# Patient Record
Sex: Male | Born: 1965 | Hispanic: No | Marital: Married | Smoking: Current every day smoker
Health system: Southern US, Community
[De-identification: ages and names within clinical notes are randomized; demographics above are authoritative.]

## PROBLEM LIST (undated history)

## (undated) HISTORY — PX: NO PAST SURGERIES: SHX2092

---

## 2003-06-21 ENCOUNTER — Ambulatory Visit (HOSPITAL_COMMUNITY): Admission: RE | Admit: 2003-06-21 | Discharge: 2003-06-21 | Payer: Self-pay | Admitting: Internal Medicine

## 2004-12-09 ENCOUNTER — Emergency Department (HOSPITAL_COMMUNITY): Admission: EM | Admit: 2004-12-09 | Discharge: 2004-12-10 | Payer: Self-pay | Admitting: Emergency Medicine

## 2004-12-19 ENCOUNTER — Emergency Department (HOSPITAL_COMMUNITY): Admission: EM | Admit: 2004-12-19 | Discharge: 2004-12-19 | Payer: Self-pay | Admitting: Emergency Medicine

## 2005-10-21 ENCOUNTER — Emergency Department: Payer: Self-pay | Admitting: Emergency Medicine

## 2006-03-02 ENCOUNTER — Emergency Department (HOSPITAL_COMMUNITY): Admission: EM | Admit: 2006-03-02 | Discharge: 2006-03-02 | Payer: Self-pay | Admitting: Emergency Medicine
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
## 2009-07-21 ENCOUNTER — Emergency Department (HOSPITAL_COMMUNITY): Admission: EM | Admit: 2009-07-21 | Discharge: 2009-07-22 | Payer: Self-pay | Admitting: Emergency Medicine

## 2014-01-28 ENCOUNTER — Emergency Department: Payer: Self-pay | Admitting: Emergency Medicine

## 2014-01-28 LAB — LIPASE, BLOOD: Lipase: 86 U/L (ref 73–393)

## 2014-01-28 LAB — CBC
HCT: 43 % (ref 40.0–52.0)
HGB: 14.3 g/dL (ref 13.0–18.0)
MCH: 32.1 pg (ref 26.0–34.0)
MCHC: 33.3 g/dL (ref 32.0–36.0)
MCV: 96 fL (ref 80–100)
Platelet: 348 10*3/uL (ref 150–440)
RBC: 4.45 10*6/uL (ref 4.40–5.90)
RDW: 14.2 % (ref 11.5–14.5)
WBC: 10.9 10*3/uL — ABNORMAL HIGH (ref 3.8–10.6)

## 2014-01-28 LAB — COMPREHENSIVE METABOLIC PANEL
Albumin: 4.1 g/dL (ref 3.4–5.0)
Alkaline Phosphatase: 84 U/L
Anion Gap: 12 (ref 7–16)
BUN: 7 mg/dL (ref 7–18)
Bilirubin,Total: 0.2 mg/dL (ref 0.2–1.0)
Calcium, Total: 9 mg/dL (ref 8.5–10.1)
Chloride: 109 mmol/L — ABNORMAL HIGH (ref 98–107)
Co2: 24 mmol/L (ref 21–32)
Creatinine: 1.01 mg/dL (ref 0.60–1.30)
EGFR (African American): >60
EGFR (Non-African Amer.): >60
Glucose: 97 mg/dL (ref 65–99)
Osmolality: 287 (ref 275–301)
Potassium: 3.9 mmol/L (ref 3.5–5.1)
SGOT(AST): 23 U/L (ref 15–37)
SGPT (ALT): 30 U/L
Sodium: 145 mmol/L (ref 136–145)
Total Protein: 7.9 g/dL (ref 6.4–8.2)

## 2014-01-28 LAB — TROPONIN I
Troponin-I: <0.02 ng/mL
Troponin-I: <0.02 ng/mL

## 2018-08-13 ENCOUNTER — Other Ambulatory Visit: Payer: Self-pay

## 2018-08-13 ENCOUNTER — Ambulatory Visit
Admission: EM | Admit: 2018-08-13 | Discharge: 2018-08-13 | Disposition: A | Discharge status: Home / Self Care | Payer: BLUE CROSS/BLUE SHIELD | Attending: Family Medicine | Admitting: Family Medicine

## 2018-08-13 ENCOUNTER — Encounter: Payer: Self-pay | Admitting: Emergency Medicine

## 2018-08-13 DIAGNOSIS — M5431 Sciatica, right side: Secondary | ICD-10-CM | POA: Diagnosis not present

## 2018-08-13 DIAGNOSIS — M5432 Sciatica, left side: Secondary | ICD-10-CM | POA: Diagnosis not present

## 2018-08-13 MED ORDER — HYDROCODONE-ACETAMINOPHEN 5-325 MG PO TABS: ORAL_TABLET | ORAL | 0 refills | Status: AC

## 2018-08-13 MED ORDER — CYCLOBENZAPRINE HCL 10 MG PO TABS: 10.0000 mg | ORAL_TABLET | Freq: Every day | ORAL | 0 refills | Status: AC

## 2018-08-13 MED ORDER — PREDNISONE 20 MG PO TABS: ORAL_TABLET | ORAL | 0 refills | Status: AC

## 2018-08-13 NOTE — Discharge Instructions (Signed)
Follow up with Primary Care provider for possible referrals for MRI and/or back specialist

## 2018-08-13 NOTE — ED Triage Notes (Signed)
Patient in today c/o low back pain that goes into hips and radiates down his leg x 1 month. 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

## 2018-08-13 NOTE — ED Provider Notes (Signed)
[facility]    CSN: [id number] Arrival date & time: 08/13/18  2751     History   Chief Complaint Chief Complaint  Patient presents with  . Back Pain    HPI Frederick Walters is a 53 y.o. male.   53 yo male with a c/o low back pain that radiates down his legs intermittently and alternating legs. Also states sometimes feels tingling down his legs. States he's had this for about 1 year and has seen PCP in the past for this and treated for sciatica. Patient states seems to be getting worse. Denies any saddle anesthesia, bowel or bladder changes/problems, fevers.      History reviewed. No pertinent past medical history.  There are no active problems to display for this patient.   Past Surgical History:  Procedure Laterality Date  . NO PAST SURGERIES         Home Medications    Prior to Admission medications   Medication Sig Start Date End Date Taking? Authorizing Provider  cyclobenzaprine (FLEXERIL) 10 MG tablet Take 1 tablet (10 mg total) by mouth at bedtime. 08/13/18   Norval Gable, MD  HYDROcodone-acetaminophen (NORCO/VICODIN) 5-325 MG tablet 1-2 tabs po qd prn 08/13/18   Norval Gable, MD  predniSONE (DELTASONE) 20 MG tablet 3 tabs po qd x 2 days, then 2 tabs po qd x 2 days, then 1 tab po qd x 2 days, then half a tab po qd x 2 days 08/13/18   Norval Gable, MD    Family History Family History  Problem Relation Age of Onset  . COPD Mother   . CAD Mother 80       stent x 1  . Hypertension Mother   . Diabetes Mother   . CAD Father        stent x 1  . Hypertension Father   . Hyperlipidemia Father   . Diabetes Father     Social History Social History   Tobacco Use  . Smoking status: Current Every Day Smoker    Packs/day: 1.00    Years: 25.00    Pack years: 25.00    Types: Cigarettes  . Smokeless tobacco: Never Used  Substance Use Topics  . Alcohol use: Yes    Alcohol/week: 28.0 standard drinks    Types: 28 Cans of beer per week   Comment: 4-5 beers every evening  . Drug use: Never     Allergies   Patient has no known allergies.   Review of Systems Review of Systems   Physical Exam Triage Vital Signs ED Triage Vitals  Enc Vitals Group     BP 08/13/18 0823 (!) 151/95     Pulse Rate 08/13/18 0823 (!) 56     Resp 08/13/18 0823 16     Temp 08/13/18 0823 97.7 F (36.5 C)     Temp Source 08/13/18 0823 Oral     SpO2 08/13/18 0823 99 %     Weight 08/13/18 0824 197 lb (89.4 kg)     Height 08/13/18 0824 6' (1.829 m)     Head Circumference --      Peak Flow --      Pain Score 08/13/18 0824 8     Pain Loc --      Pain Edu? --      Excl. in [place]? --    No data found.  Updated Vital Signs BP (!) 151/95 (BP Location: Left Arm)   Pulse (!) 56   Temp 97.7  F (36.5 C) (Oral)   Resp 16   Ht 6' (1.829 m)   Wt 89.4 kg   SpO2 99%   BMI 26.72 kg/m   Visual Acuity Right Eye Distance:   Left Eye Distance:   Bilateral Distance:    Right Eye Near:   Left Eye Near:    Bilateral Near:     Physical Exam Vitals signs and nursing note reviewed.  Constitutional:      General: He is not in acute distress.    Appearance: He is well-developed. He is not toxic-appearing or diaphoretic.  Neck:     Trachea: No tracheal deviation.  Pulmonary:     Effort: Pulmonary effort is normal. No respiratory distress.     Breath sounds: No stridor.  Musculoskeletal:     Lumbar back: He exhibits tenderness (paraspinous muscles) and spasm. He exhibits normal range of motion, no bony tenderness, no swelling, no edema, no deformity, no laceration, no pain and normal pulse.  Skin:    Findings: No rash.  Neurological:     General: No focal deficit present.     Mental Status: He is alert.     Motor: No abnormal muscle tone.     Coordination: Coordination normal.     Deep Tendon Reflexes: Reflexes are normal and symmetric. Reflexes normal.      UC Treatments / Results  Labs (all labs ordered are listed, but only abnormal  results are displayed) Labs Reviewed - No data to display  EKG None  Radiology No results found.  Procedures Procedures (including critical care time)  Medications Ordered in UC Medications - No data to display  Initial Impression / Assessment and Plan / UC Course  I have reviewed the triage vital signs and the nursing notes.  Pertinent labs & imaging results that were available during my care of the patient were reviewed by me and considered in my medical decision making (see chart for details).      Final Clinical Impressions(s) / UC Diagnoses   Final diagnoses:  Bilateral sciatica     Discharge Instructions     Follow up with Primary Care provider for possible referrals for MRI and/or back specialist    ED Prescriptions    Medication Sig Dispense Auth. Provider   predniSONE (DELTASONE) 20 MG tablet 3 tabs po qd x 2 days, then 2 tabs po qd x 2 days, then 1 tab po qd x 2 days, then half a tab po qd x 2 days 13 tablet Yong Grieser, MD   cyclobenzaprine (FLEXERIL) 10 MG tablet Take 1 tablet (10 mg total) by mouth at bedtime. 30 tablet Norval Gable, MD   HYDROcodone-acetaminophen (NORCO/VICODIN) 5-325 MG tablet 1-2 tabs po qd prn 6 tablet Norval Gable, MD     1. diagnosis reviewed with patient 2. rx as per orders above; reviewed possible side effects, interactions, risks and benefits  3. Recommend supportive treatment with rest, heat/ice to area 4. Follow-up with PCP for further evaluation, referrals as noted above 5. F/u prn   Controlled Substance Prescriptions [place] Controlled Substance Registry consulted?    Norval Gable, MD 08/13/18 [phone]

## 2020-10-09 ENCOUNTER — Other Ambulatory Visit: Payer: Self-pay | Admitting: Family Medicine

## 2020-10-09 DIAGNOSIS — K219 Gastro-esophageal reflux disease without esophagitis: Secondary | ICD-10-CM

## 2020-10-11 ENCOUNTER — Other Ambulatory Visit: Payer: Self-pay | Admitting: Family Medicine

## 2020-10-11 DIAGNOSIS — F1010 Alcohol abuse, uncomplicated: Secondary | ICD-10-CM

## 2020-10-11 DIAGNOSIS — R140 Abdominal distension (gaseous): Secondary | ICD-10-CM

## 2020-10-11 DIAGNOSIS — K219 Gastro-esophageal reflux disease without esophagitis: Secondary | ICD-10-CM

## 2020-10-12 ENCOUNTER — Ambulatory Visit
Admission: RE | Admit: 2020-10-12 | Discharge: 2020-10-12 | Disposition: A | Discharge status: Home / Self Care | Payer: BC Managed Care – PPO | Source: Ambulatory Visit | Attending: Family Medicine | Admitting: Family Medicine

## 2020-10-12 ENCOUNTER — Other Ambulatory Visit: Payer: Self-pay

## 2020-10-12 DIAGNOSIS — K219 Gastro-esophageal reflux disease without esophagitis: Secondary | ICD-10-CM | POA: Diagnosis not present

## 2020-10-12 DIAGNOSIS — F1010 Alcohol abuse, uncomplicated: Secondary | ICD-10-CM | POA: Diagnosis present

## 2020-10-12 DIAGNOSIS — R140 Abdominal distension (gaseous): Secondary | ICD-10-CM | POA: Insufficient documentation

## 2021-06-12 ENCOUNTER — Encounter: Payer: Self-pay | Admitting: Gastroenterology

## 2021-06-12 NOTE — H&P (Signed)
Pre-Procedure H&P   Patient ID: Frederick Walters is a 55 y.o. male.  Gastroenterology Provider: Annamaria Helling, DO  Referring Provider: Stephens November, NP  Date: 06/13/2021  HPI Frederick Walters is a 55 y.o. male who presents today for Colonoscopy for initial screening colonoscopy. +tobacco, etoh history; reports perianal cyst/boil with possible purulent drainage when seen in office in October.  No longer has this, but it "comes and goes." Normal fecal calprotectin No fhx crc or colon polyps.  No other acute gi complaints  Past Medical History:  Diagnosis Date   GERD (gastroesophageal reflux disease)    H/O seasonal allergies    History of shingles     Past Surgical History:  Procedure Laterality Date   NO PAST SURGERIES      Family History No h/o GI disease or malignancy  Review of Systems  Constitutional:  Negative for activity change, appetite change, chills, diaphoresis, fatigue, fever and unexpected weight change.  HENT:  Negative for trouble swallowing and voice change.   Respiratory:  Negative for shortness of breath and wheezing.   Cardiovascular:  Negative for chest pain, palpitations and leg swelling.  Gastrointestinal:  Positive for rectal pain (reported perianal cyst/boil with possible purulent discharge). Negative for abdominal distention, abdominal pain, anal bleeding, blood in stool, constipation, diarrhea, nausea and vomiting.  Musculoskeletal:  Negative for arthralgias and myalgias.  Skin:  Negative for color change and pallor.  Neurological:  Negative for dizziness, syncope and weakness.  Psychiatric/Behavioral:  Negative for confusion. The patient is not nervous/anxious.   All other systems reviewed and are negative.   Medications No current facility-administered medications on file prior to encounter.   Current Outpatient Medications on File Prior to Encounter  Medication Sig Dispense Refill   cyclobenzaprine (FLEXERIL) 10  MG tablet Take 1 tablet (10 mg total) by mouth at bedtime. (Patient not taking: Reported on 06/13/2021) 30 tablet 0   Ganciclovir (ZIRGAN) 0.15 % GEL Place 1 drop into both eyes 2 (two) times daily. (Patient not taking: Reported on 06/13/2021)     HYDROcodone-acetaminophen (NORCO/VICODIN) 5-325 MG tablet 1-2 tabs po qd prn (Patient not taking: Reported on 06/13/2021) 6 tablet 0   predniSONE (DELTASONE) 20 MG tablet 3 tabs po qd x 2 days, then 2 tabs po qd x 2 days, then 1 tab po qd x 2 days, then half a tab po qd x 2 days (Patient not taking: Reported on 06/13/2021) 13 tablet 0   valACYclovir (VALTREX) 1000 MG tablet Take 1,000 mg by mouth 3 (three) times daily. (Patient not taking: Reported on 06/13/2021)      Pertinent medications related to GI and procedure were reviewed by me with the patient prior to the procedure   Current Facility-Administered Medications:    0.9 %  sodium chloride infusion, , Intravenous, Continuous, Annamaria Helling, DO, Last Rate: 20 mL/hr at 06/13/21 1309, Continued from Pre-op at 06/13/21 1309      No Known Allergies Allergies were reviewed by me prior to the procedure  Objective    Vitals:   06/13/21 1237  BP: (!) 145/77  Pulse: 63  Resp: 16  Temp: (!) 96.7 F (35.9 C)  TempSrc: Tympanic  SpO2: 98%  Weight: 93 kg  Height: 6' (1.829 m)     Physical Exam Vitals and nursing note reviewed.  Constitutional:      General: He is not in acute distress.    Appearance: Normal appearance. He is not ill-appearing, toxic-appearing or diaphoretic.  HENT:     Head: Normocephalic and atraumatic.     Nose: Nose normal.     Mouth/Throat:     Mouth: Mucous membranes are moist.     Pharynx: Oropharynx is clear.  Eyes:     General: No scleral icterus.    Extraocular Movements: Extraocular movements intact.  Cardiovascular:     Rate and Rhythm: Normal rate and regular rhythm.     Heart sounds: Normal heart sounds. No murmur heard.   No friction rub. No  gallop.  Pulmonary:     Effort: Pulmonary effort is normal. No respiratory distress.     Breath sounds: Normal breath sounds. No wheezing, rhonchi or rales.  Abdominal:     General: Bowel sounds are normal. There is no distension.     Palpations: Abdomen is soft.     Tenderness: There is no abdominal tenderness. There is no guarding or rebound.  Musculoskeletal:     Cervical back: Neck supple.     Right lower leg: No edema.     Left lower leg: No edema.  Skin:    General: Skin is warm and dry.     Coloration: Skin is not jaundiced or pale.  Neurological:     General: No focal deficit present.     Mental Status: He is alert and oriented to person, place, and time. Mental status is at baseline.  Psychiatric:        Mood and Affect: Mood normal.        Behavior: Behavior normal.        Thought Content: Thought content normal.        Judgment: Judgment normal.     Assessment:  Mr. Frederick Walters is a 55 y.o. male  who presents today for Colonoscopy for initial screening colonoscopy.  Plan:  Colonoscopy with possible intervention today  Colonoscopy with possible biopsy, control of bleeding, polypectomy, and interventions as necessary has been discussed with the patient/patient representative. Informed consent was obtained from the patient/patient representative after explaining the indication, nature, and risks of the procedure including but not limited to death, bleeding, perforation, missed neoplasm/lesions, cardiorespiratory compromise, and reaction to medications. Opportunity for questions was given and appropriate answers were provided. Patient/patient representative has verbalized understanding is amenable to undergoing the procedure.   Annamaria Helling, DO  [facility]  Portions of the record may have been created with voice recognition software. Occasional wrong-word or 'sound-a-like' substitutions may have occurred due to the inherent  limitations of voice recognition software.  Read the chart carefully and recognize, using context, where substitutions may have occurred.

## 2021-06-13 ENCOUNTER — Ambulatory Visit: Payer: BC Managed Care – PPO | Admitting: Anesthesiology

## 2021-06-13 ENCOUNTER — Encounter: Payer: Self-pay | Admitting: Gastroenterology

## 2021-06-13 ENCOUNTER — Encounter
Admission: RE | Disposition: A | Discharge status: Home / Self Care | Payer: Self-pay | Source: Ambulatory Visit | Attending: Gastroenterology

## 2021-06-13 ENCOUNTER — Ambulatory Visit
Admission: RE | Admit: 2021-06-13 | Discharge: 2021-06-13 | Disposition: A | Discharge status: Home / Self Care | Payer: BC Managed Care – PPO | Source: Ambulatory Visit | Attending: Gastroenterology | Admitting: Gastroenterology

## 2021-06-13 DIAGNOSIS — Z1211 Encounter for screening for malignant neoplasm of colon: Secondary | ICD-10-CM | POA: Diagnosis not present

## 2021-06-13 DIAGNOSIS — K5730 Diverticulosis of large intestine without perforation or abscess without bleeding: Secondary | ICD-10-CM | POA: Diagnosis not present

## 2021-06-13 DIAGNOSIS — D123 Benign neoplasm of transverse colon: Secondary | ICD-10-CM | POA: Insufficient documentation

## 2021-06-13 DIAGNOSIS — F17200 Nicotine dependence, unspecified, uncomplicated: Secondary | ICD-10-CM | POA: Insufficient documentation

## 2021-06-13 DIAGNOSIS — D125 Benign neoplasm of sigmoid colon: Secondary | ICD-10-CM | POA: Diagnosis not present

## 2021-06-13 DIAGNOSIS — D124 Benign neoplasm of descending colon: Secondary | ICD-10-CM | POA: Insufficient documentation

## 2021-06-13 HISTORY — PX: COLONOSCOPY WITH PROPOFOL: SHX5780

## 2021-06-13 SURGERY — COLONOSCOPY WITH PROPOFOL
Anesthesia: General

## 2021-06-13 MED ORDER — STERILE WATER FOR IRRIGATION IR SOLN
Status: DC | PRN
  Administered 2021-06-13: 45 mL

## 2021-06-13 MED ORDER — PROPOFOL 500 MG/50ML IV EMUL
INTRAVENOUS | Status: DC | PRN
  Administered 2021-06-13: 160 ug/kg/min via INTRAVENOUS

## 2021-06-13 MED ORDER — PROPOFOL 10 MG/ML IV BOLUS
INTRAVENOUS | Status: AC
  Filled 2021-06-13: qty 20

## 2021-06-13 MED ORDER — SODIUM CHLORIDE 0.9 % IV SOLN: INTRAVENOUS | Status: DC

## 2021-06-13 MED ORDER — PHENYLEPHRINE 40 MCG/ML (10ML) SYRINGE FOR IV PUSH (FOR BLOOD PRESSURE SUPPORT)
PREFILLED_SYRINGE | INTRAVENOUS | Status: DC | PRN
  Administered 2021-06-13 (×3): 120 ug via INTRAVENOUS

## 2021-06-13 MED ORDER — PROPOFOL 10 MG/ML IV BOLUS
INTRAVENOUS | Status: DC | PRN
  Administered 2021-06-13: 80 mg via INTRAVENOUS

## 2021-06-13 NOTE — Transfer of Care (Signed)
Immediate Anesthesia Transfer of Care Note  Patient: Frederick Walters  Procedure(s) Performed: COLONOSCOPY WITH PROPOFOL  Patient Location: PACU  Anesthesia Type:General  Level of Consciousness: drowsy  Airway & Oxygen Therapy: Patient Spontanous Breathing  Post-op Assessment: Report given to RN  Post vital signs: Reviewed and stable  Last Vitals:  Vitals Value Taken Time  BP    Temp    Pulse    Resp    SpO2      Last Pain:  Vitals:   06/13/21 1237  TempSrc: Tympanic  PainSc: 0-No pain         Complications: No notable events documented.

## 2021-06-13 NOTE — Interval H&P Note (Signed)
History and Physical Interval Note: Preprocedure H&P from 06/13/21  was reviewed and there was no interval change after seeing and examining the patient.  Written consent was obtained from the patient after discussion of risks, benefits, and alternatives. Patient has consented to proceed with Colonoscopy with possible intervention   06/13/2021 2:01 PM  Frederick Walters  has presented today for surgery, with the diagnosis of SCREENING.  The various methods of treatment have been discussed with the patient and family. After consideration of risks, benefits and other options for treatment, the patient has consented to  Procedure(s): COLONOSCOPY WITH PROPOFOL (N/A) as a surgical intervention.  The patient's history has been reviewed, patient examined, no change in status, stable for surgery.  I have reviewed the patient's chart and labs.  Questions were answered to the patient's satisfaction.     Annamaria Helling

## 2021-06-13 NOTE — Op Note (Signed)
[facility] Gastroenterology Patient Name: Udell Mazzocco Procedure Date: 06/13/2021 2:02 PM MRN: [id number] Account #: [id number] Date of Birth: 07/25/1965 Admit Type: Outpatient Age: 55 Room: [facility] ENDO ROOM 1 Gender: Male Note Status: Finalized Instrument Name: Colonscope [id number] Procedure:             Colonoscopy Indications:           Screening for colorectal malignant neoplasm Providers:             Annamaria Helling DO, DO Referring MD:          Theodore Demark (Referring MD) Medicines:             Monitored Anesthesia Care Complications:         No immediate complications. Estimated blood loss:                         Minimal. Procedure:             Pre-Anesthesia Assessment:                        - Prior to the procedure, a History and Physical was                         performed, and patient medications and allergies were                         reviewed. The patient is competent. The risks and                         benefits of the procedure and the sedation options and                         risks were discussed with the patient. All questions                         were answered and informed consent was obtained.                         Patient identification and proposed procedure were                         verified by the physician, the nurse, the anesthetist                         and the technician in the endoscopy suite. Mental                         Status Examination: alert and oriented. Airway                         Examination: normal oropharyngeal airway and neck                         mobility. Respiratory Examination: clear to                         auscultation. CV Examination: RRR, no murmurs, no S3  or S4. Prophylactic Antibiotics: The patient does not                         require prophylactic antibiotics. Prior                         Anticoagulants: The patient has taken no previous                          anticoagulant or antiplatelet agents. ASA Grade                         Assessment: II - A patient with mild systemic disease.                         After reviewing the risks and benefits, the patient                         was deemed in satisfactory condition to undergo the                         procedure. The anesthesia plan was to use monitored                         anesthesia care (MAC). Immediately prior to                         administration of medications, the patient was                         re-assessed for adequacy to receive sedatives. The                         heart rate, respiratory rate, oxygen saturations,                         blood pressure, adequacy of pulmonary ventilation, and                         response to care were monitored throughout the                         procedure. The physical status of the patient was                         re-assessed after the procedure.                        After obtaining informed consent, the colonoscope was                         passed under direct vision. Throughout the procedure,                         the patient's blood pressure, pulse, and oxygen                         saturations were monitored continuously. The  Colonoscope was introduced through the anus and                         advanced to the the cecum, identified by appendiceal                         orifice and ileocecal valve. The colonoscopy was                         performed without difficulty. The patient tolerated                         the procedure well. The quality of the bowel                         preparation was evaluated using the BBPS [facility] Bowel                         Preparation Scale) with scores of: Right Colon = 3,                         Transverse Colon = 3 and Left Colon = 3 (entire mucosa                         seen well with no residual staining, small fragments                          of stool or opaque liquid). The total BBPS score                         equals 9. The quality of the bowel preparation was                         evaluated using the BBPS [facility] Bowel Preparation                         Scale) with scores of: Right Colon = 2 (minor amount                         of residual staining, small fragments of stool and/or                         opaque liquid, but mucosa seen well), Transverse Colon                         = 3 (entire mucosa seen well with no residual                         staining, small fragments of stool or opaque liquid)                         and Left Colon = 2 (minor amount of residual staining,                         small fragments of stool and/or opaque liquid, but  mucosa seen well). The total BBPS score equals 7. The                         quality of the bowel preparation was good. The                         ileocecal valve, appendiceal orifice, and rectum were                         photographed. Findings:      The perianal and digital rectal examinations were normal. Pertinent       negatives include normal sphincter tone.      A few small-mouthed diverticula were found in the recto-sigmoid colon       and sigmoid colon. Estimated blood loss: none.      A 1 to 2 mm polyp was found in the transverse colon. The polyp was       sessile. The polyp was removed with a cold biopsy forceps. Resection and       retrieval were complete. Estimated blood loss was minimal.      A 4 to 5 mm polyp was found in the transverse colon. The polyp was       sessile. The polyp was removed with a cold snare. Resection and       retrieval were complete. Estimated blood loss was minimal.      A 1 to 2 mm polyp was found in the transverse colon. The polyp was       sessile. The polyp was removed with a cold biopsy forceps. Resection and       retrieval were complete. Estimated blood loss was minimal.       A 3 to 4 mm polyp was found in the transverse colon. The polyp was       sessile. The polyp was removed with a cold snare. Resection and       retrieval were complete. Estimated blood loss was minimal.      A 4 to 5 mm polyp was found in the descending colon. The polyp was       sessile. The polyp was removed with a cold snare. Resection and       retrieval were complete. Estimated blood loss was minimal.      A 1 to 2 mm polyp was found in the sigmoid colon. The polyp was sessile.       The polyp was removed with a cold biopsy forceps. Resection and       retrieval were complete. Estimated blood loss was minimal.      The exam was otherwise without abnormality on direct and retroflexion       views. Impression:            - Diverticulosis in the recto-sigmoid colon and in the                         sigmoid colon.                        - One 1 to 2 mm polyp in the transverse colon, removed                         with a cold biopsy forceps. Resected and retrieved.                        -  One 4 to 5 mm polyp in the transverse colon, removed                         with a cold snare. Resected and retrieved.                        - One 1 to 2 mm polyp in the transverse colon, removed                         with a cold biopsy forceps. Resected and retrieved.                        - One 3 to 4 mm polyp in the transverse colon, removed                         with a cold snare. Resected and retrieved.                        - One 4 to 5 mm polyp in the descending colon, removed                         with a cold snare. Resected and retrieved.                        - One 1 to 2 mm polyp in the sigmoid colon, removed                         with a cold biopsy forceps. Resected and retrieved.                        - The examination was otherwise normal on direct and                         retroflexion views. Recommendation:        - Discharge patient to home.                        -  Resume previous diet.                        - Continue present medications.                        - Await pathology results.                        - Repeat colonoscopy for surveillance based on                         pathology results.                        - Return to referring physician as previously                         scheduled. Procedure Code(s):     --- Professional ---  45385, Colonoscopy, flexible; with removal of                         tumor(s), polyp(s), or other lesion(s) by snare                         technique                        45380, 59, Colonoscopy, flexible; with biopsy, single                         or multiple Diagnosis Code(s):     --- Professional ---                        Z12.11, Encounter for screening for malignant neoplasm                         of colon                        K63.5, Polyp of colon                        K57.30, Diverticulosis of large intestine without                         perforation or abscess without bleeding CPT copyright 2019 [employer]. All rights reserved. The codes documented in this report are preliminary and upon coder review may  be revised to meet current compliance requirements. Attending Participation:      I personally performed the entire procedure. Volney American, DO Annamaria Helling DO, DO 06/13/2021 2:50:45 PM This report has been signed electronically. Number of Addenda: 0 Note Initiated On: 06/13/2021 2:02 PM Scope Withdrawal Time: 0 hours 29 minutes 37 seconds  Total Procedure Duration: 0 hours 34 minutes 30 seconds  Estimated Blood Loss:  Estimated blood loss was minimal.      [facility]

## 2021-06-13 NOTE — Anesthesia Preprocedure Evaluation (Addendum)
Anesthesia Evaluation  Patient identified by MRN, date of birth, ID band Patient awake    Reviewed: Allergy & Precautions, NPO status , Patient's Chart, lab work & pertinent test results  History of Anesthesia Complications Negative for: history of anesthetic complications  Airway Mallampati: IV   Neck ROM: Full    Dental  (+)    Pulmonary Current Smoker (1 ppd)Patient did not abstain from smoking.,    Pulmonary exam normal breath sounds clear to auscultation       Cardiovascular Exercise Tolerance: Good Normal cardiovascular exam Rhythm:Regular Rate:Normal  Stress echo 11/16/20:  Normal Stress Echocardiogram  NORMAL RIGHT VENTRICULAR SYSTOLIC FUNCTION  TRIVIAL REGURGITATION NOTED (See above)  NO VALVULAR STENOSIS NOTED   ECG 10/08/20:  Normal sinus rhythm  Left atrial enlargement  Nonspecific T wave abnormalities    Neuro/Psych Alcohol use disorder, 4-5 beers daily, last intake 06/11/21    GI/Hepatic GERD  ,  Endo/Other  negative endocrine ROS  Renal/GU negative Renal ROS     Musculoskeletal   Abdominal   Peds  Hematology negative hematology ROS (+)   Anesthesia Other Findings Reviewed 10/31/20 cardiology note.  Reproductive/Obstetrics                            Anesthesia Physical Anesthesia Plan  ASA: 2  Anesthesia Plan: General   Post-op Pain Management:    Induction: Intravenous  PONV Risk Score and Plan: 1 and Propofol infusion, TIVA and Treatment may vary due to age or medical condition  Airway Management Planned: Natural Airway  Additional Equipment:   Intra-op Plan:   Post-operative Plan:   Informed Consent: I have reviewed the patients History and Physical, chart, labs and discussed the procedure including the risks, benefits and alternatives for the proposed anesthesia with the patient or authorized representative who has indicated his/her understanding and  acceptance.       Plan Discussed with: CRNA  Anesthesia Plan Comments: (LMA/GETA backup discussed.  Patient consented for risks of anesthesia including but not limited to:  - adverse reactions to medications - damage to eyes, teeth, lips or other oral mucosa - nerve damage due to positioning  - sore throat or hoarseness - damage to heart, brain, nerves, lungs, other parts of body or loss of life  Informed patient about role of CRNA in peri- and intra-operative care.  Patient voiced understanding.)        Anesthesia Quick Evaluation

## 2021-06-13 NOTE — Anesthesia Postprocedure Evaluation (Signed)
Anesthesia Post Note  Patient: Frederick Walters  Procedure(s) Performed: COLONOSCOPY WITH PROPOFOL  Patient location during evaluation: Endoscopy Anesthesia Type: General Level of consciousness: awake and alert Pain management: pain level controlled Vital Signs Assessment: post-procedure vital signs reviewed and stable Respiratory status: spontaneous breathing, nonlabored ventilation, respiratory function stable and patient connected to nasal cannula oxygen Cardiovascular status: blood pressure returned to baseline and stable Postop Assessment: no apparent nausea or vomiting Anesthetic complications: no   No notable events documented.   Last Vitals:  Vitals:   06/13/21 1452 06/13/21 1502  BP: (!) 99/54 101/61  Pulse: 63 (!) 58  Resp: 18 19  Temp: (!) 36.1 C   SpO2: 95% 97%    Last Pain:  Vitals:   06/13/21 1502  TempSrc:   PainSc: 0-No pain                 Arita Miss

## 2021-06-18 LAB — SURGICAL PATHOLOGY

## 2023-01-06 IMAGING — US US ABDOMEN LIMITED RUQ/ASCITES
1 series · 15 of 25 positions shown · non-contrast
Comparison: None.

CLINICAL DATA: 54-year-old male with esophageal reflux and alcohol
use. Abdominal distension.

EXAM:
ULTRASOUND ABDOMEN LIMITED RIGHT UPPER QUADRANT

[Series 1: us abdomen limited ruq · 15 of 66 slices shown]
[im 1/66]
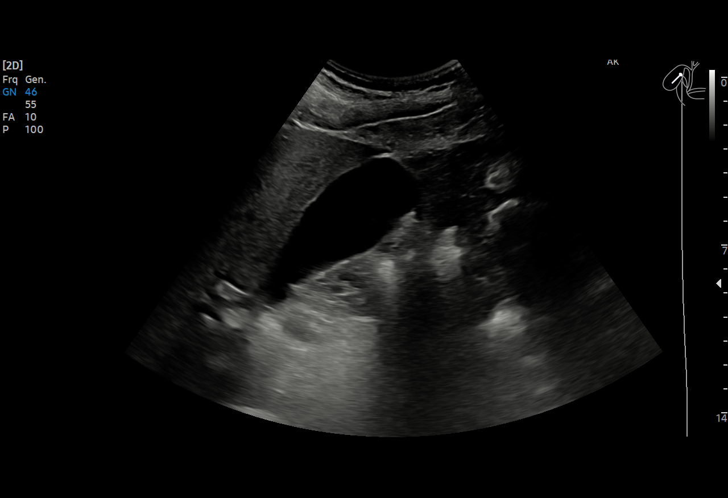
[im 6/66]
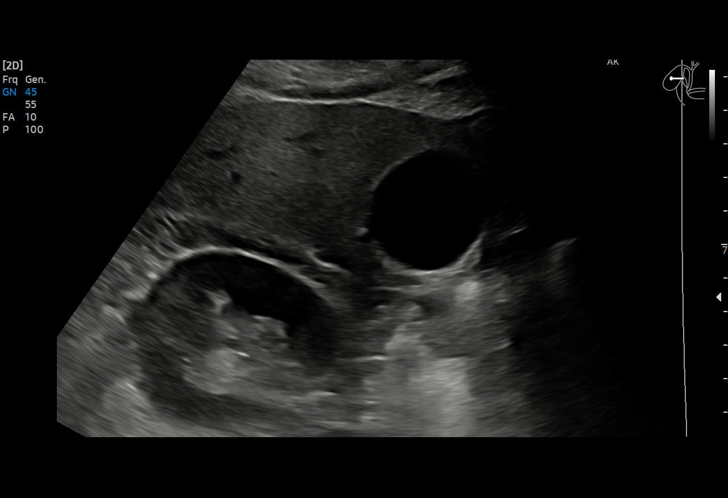
[im 11/66]
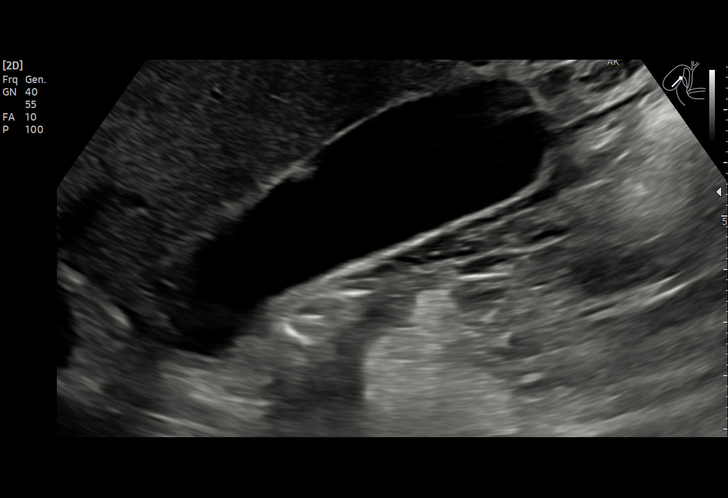
[im 14/66]
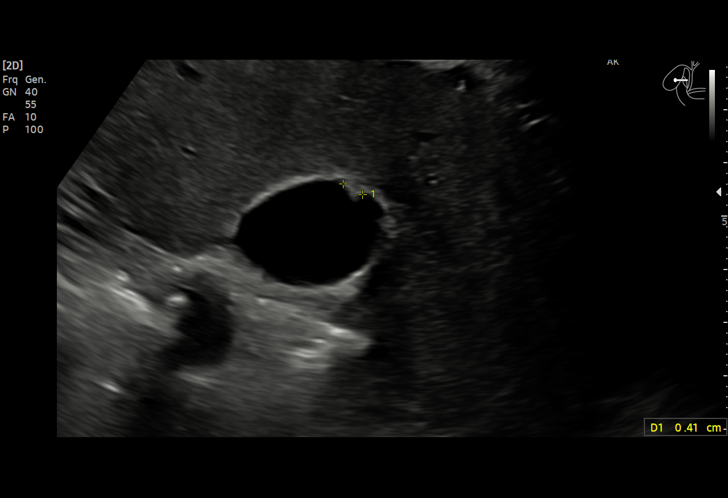
[im 19/66]
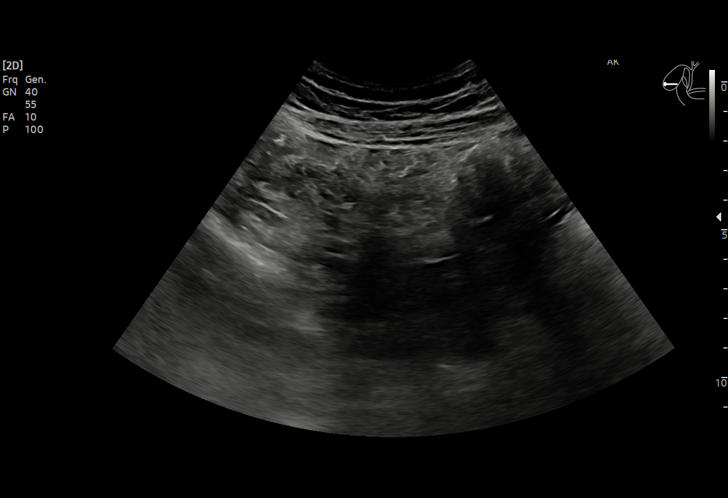
[im 25/66]
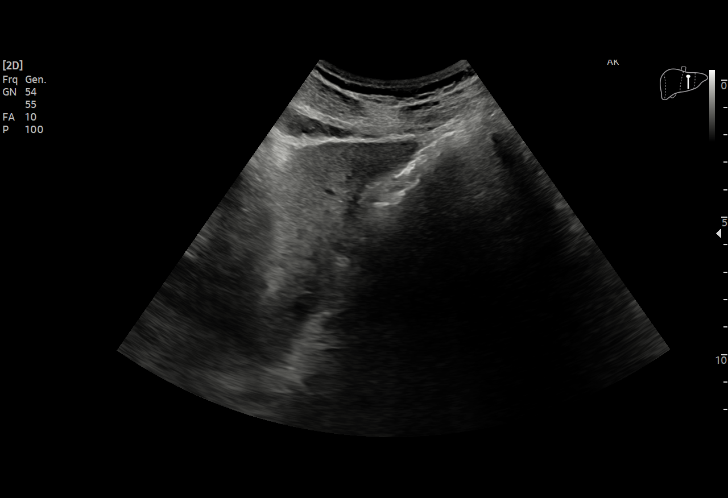
[im 28/66]
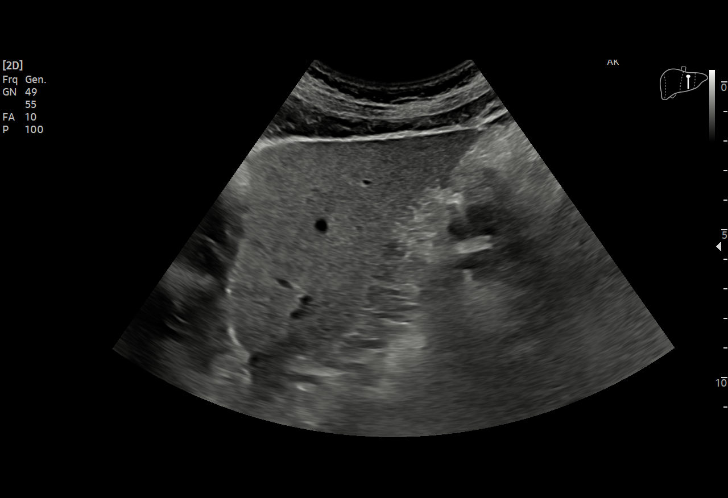
[im 33/66]
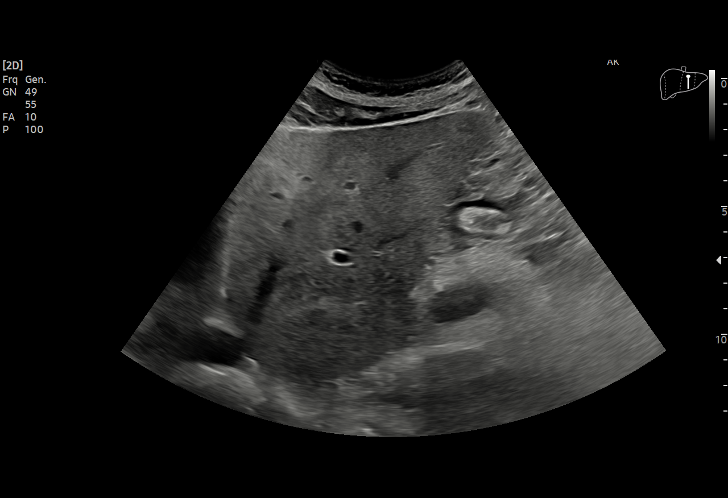
[im 38/66]
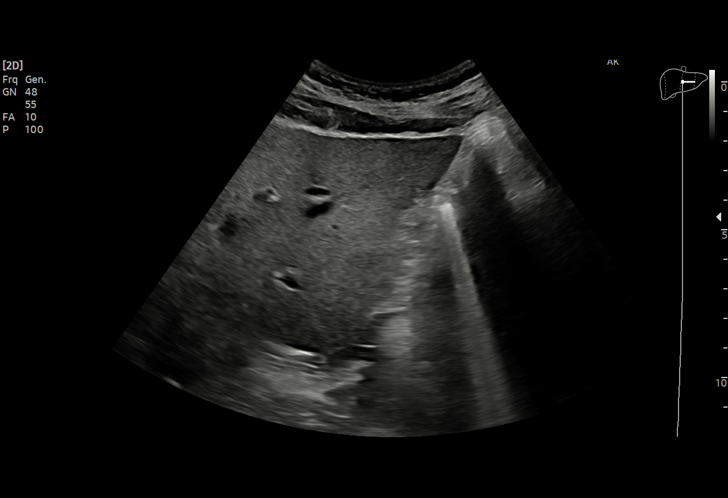
[im 41/66]
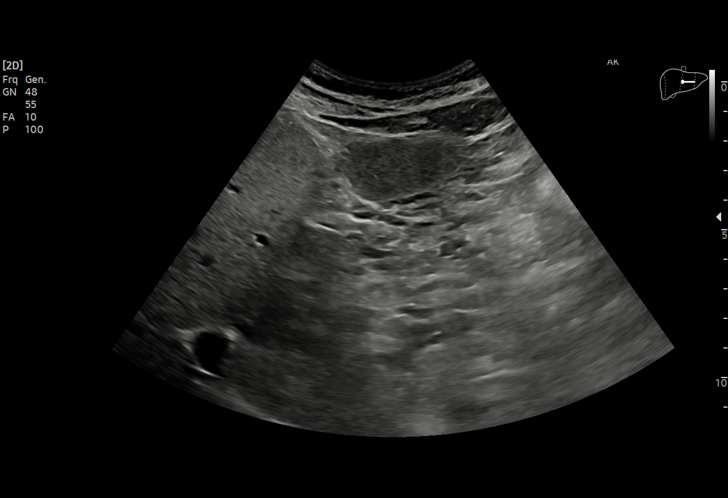
[im 47/66]
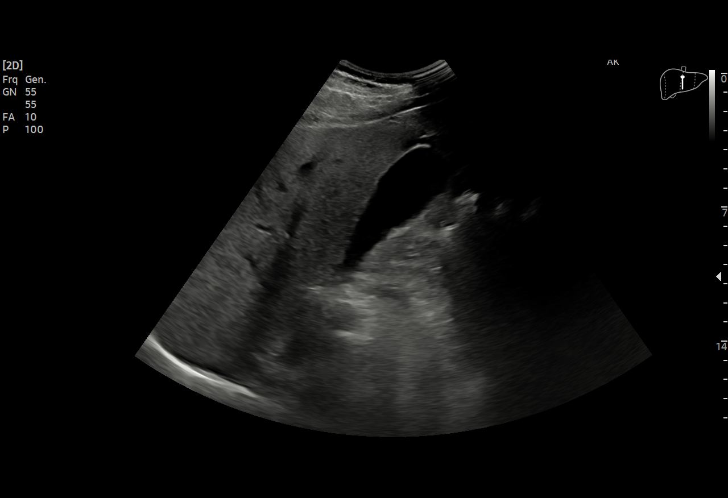
[im 52/66]
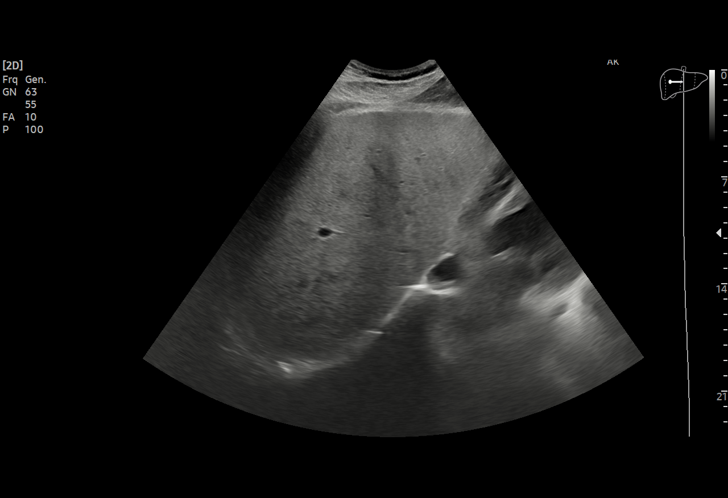
[im 55/66]
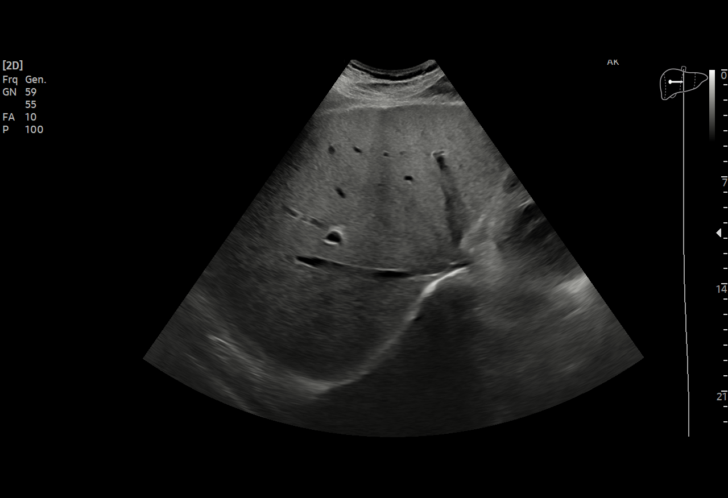
[im 60/66]
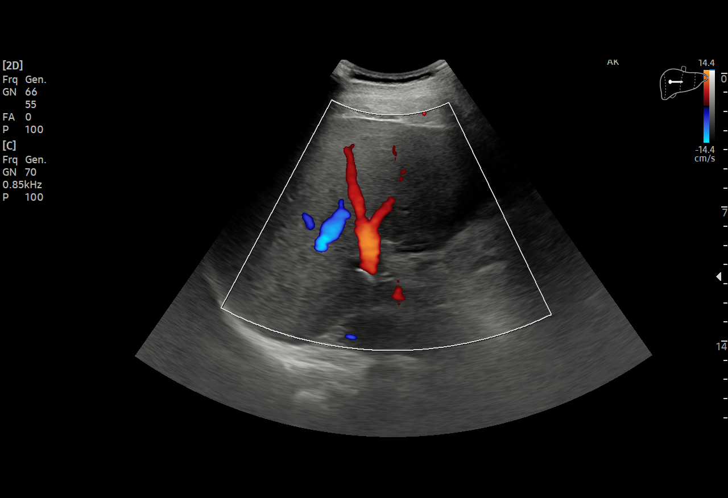
[im 66/66]
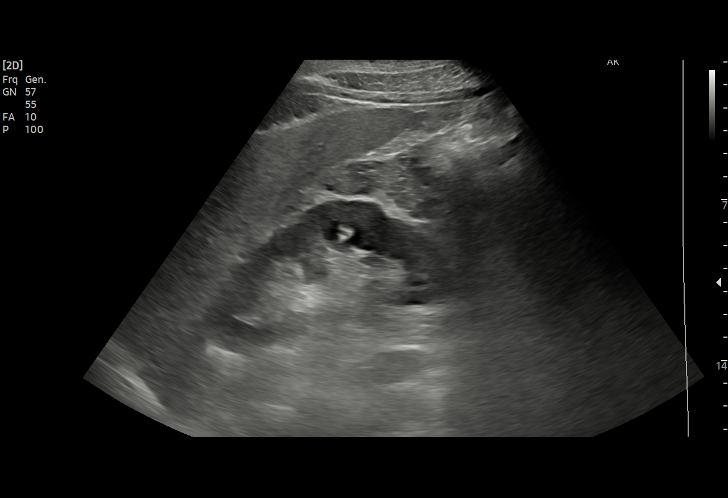

[15 of 25 positions shown; findings below may reference images not displayed]

FINDINGS: Gallbladder:

No gallstones or wall thickening visualized. No sonographic Murphy
sign noted by sonographer. There is a 4 mm polyp or adherent sludge.

Common bile duct:

Diameter: 5 mm

Liver:

There is diffuse increased liver echogenicity most commonly seen in
the setting of fatty infiltration. Superimposed inflammation or
fibrosis is not excluded. Clinical correlation is recommended.
Portal vein is patent on color Doppler imaging with normal direction
of blood flow towards the liver.

Other: None.
IMPRESSION: 1. Fatty liver.
2. No gallstone. Probable 4 mm gallbladder bladder polyp or adherent
sludge.
